# Patient Record
Sex: Male | Born: 2000 | Hispanic: No | Marital: Single | Smoking: Never smoker
Health system: Southern US, Community
[De-identification: ages and names within clinical notes are randomized; demographics above are authoritative.]

---
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
## 2018-07-18 IMAGING — DX DG WRIST COMPLETE 3+V*R*
4 series · 4 of 4 positions shown · non-contrast
Comparison: None.

CLINICAL DATA: Crush injury with laceration

EXAM:
RIGHT WRIST - COMPLETE 3+ VIEW

[wrist pa]
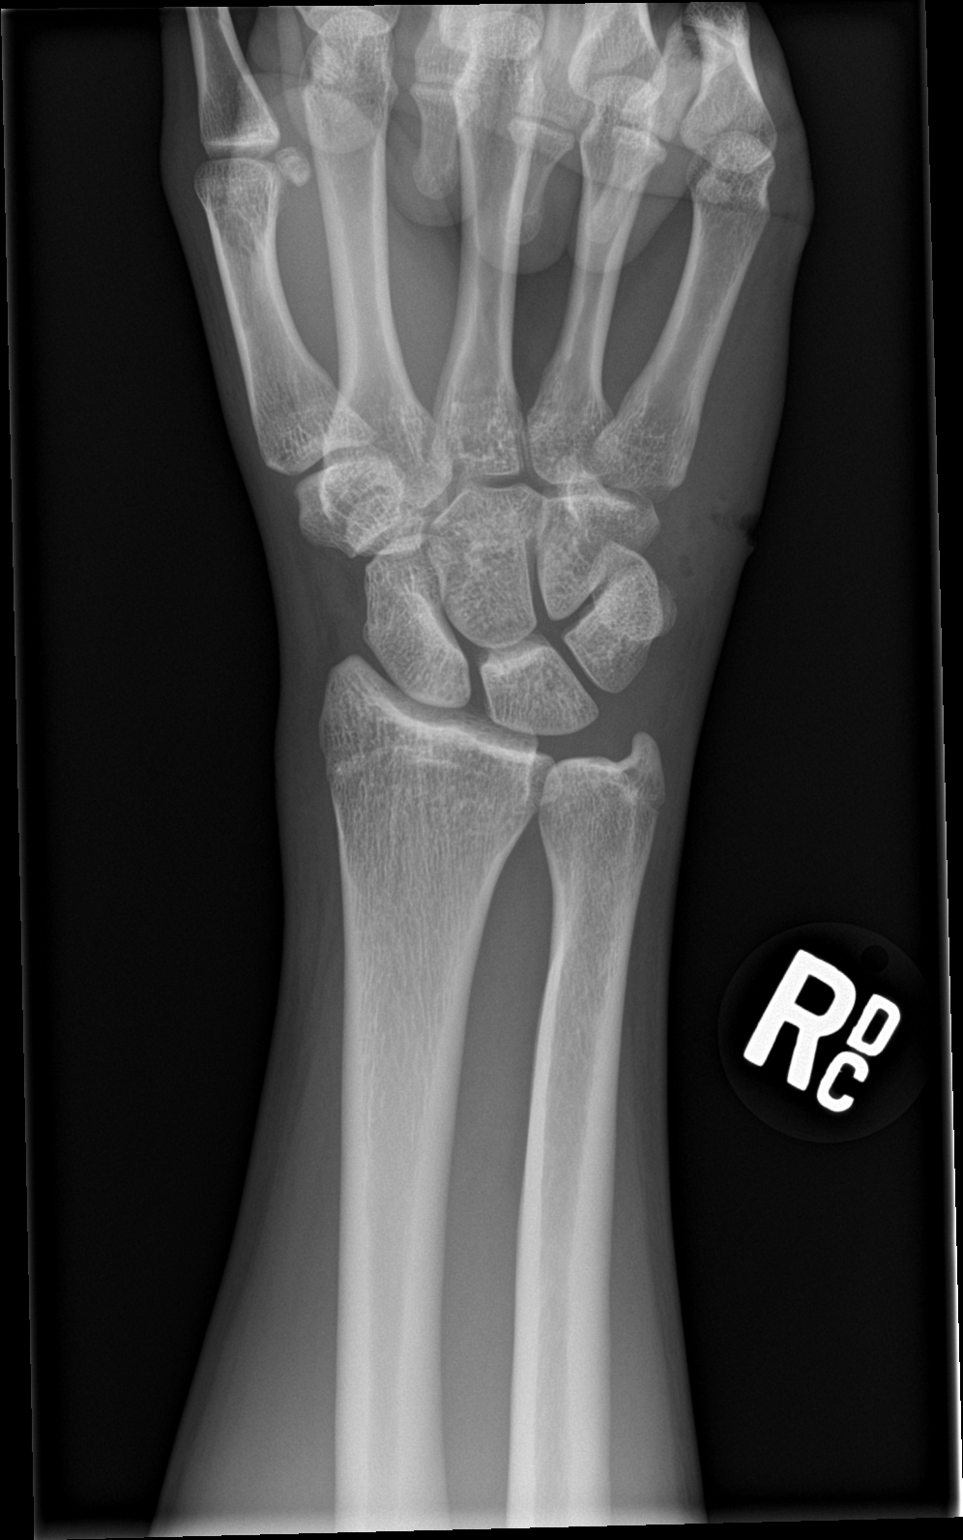

[wrist obl]
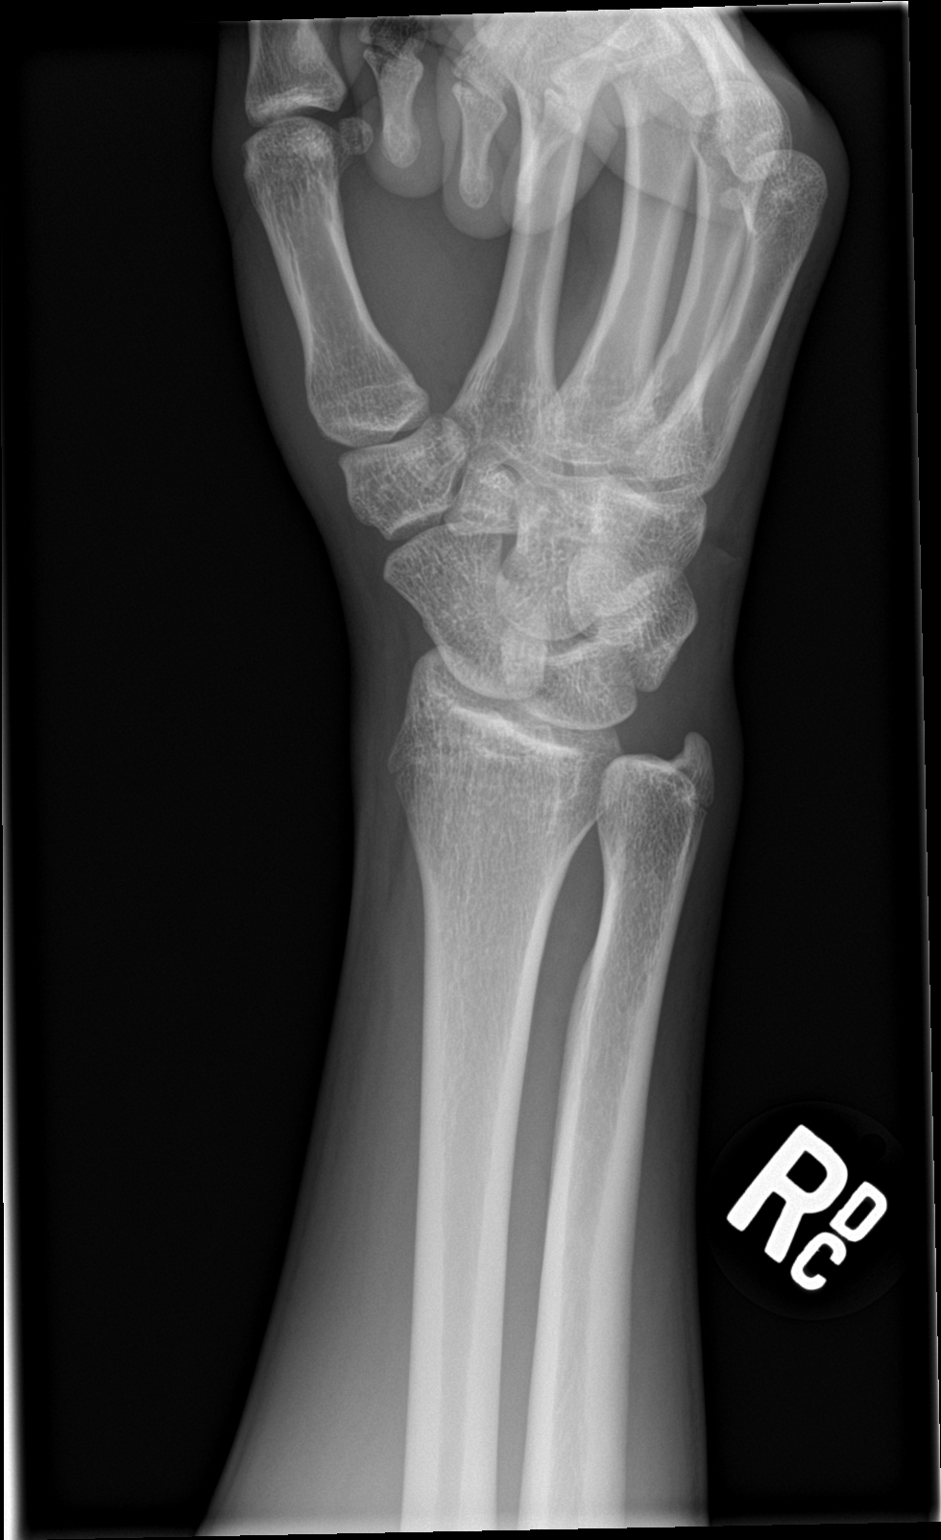

[wrist lat]
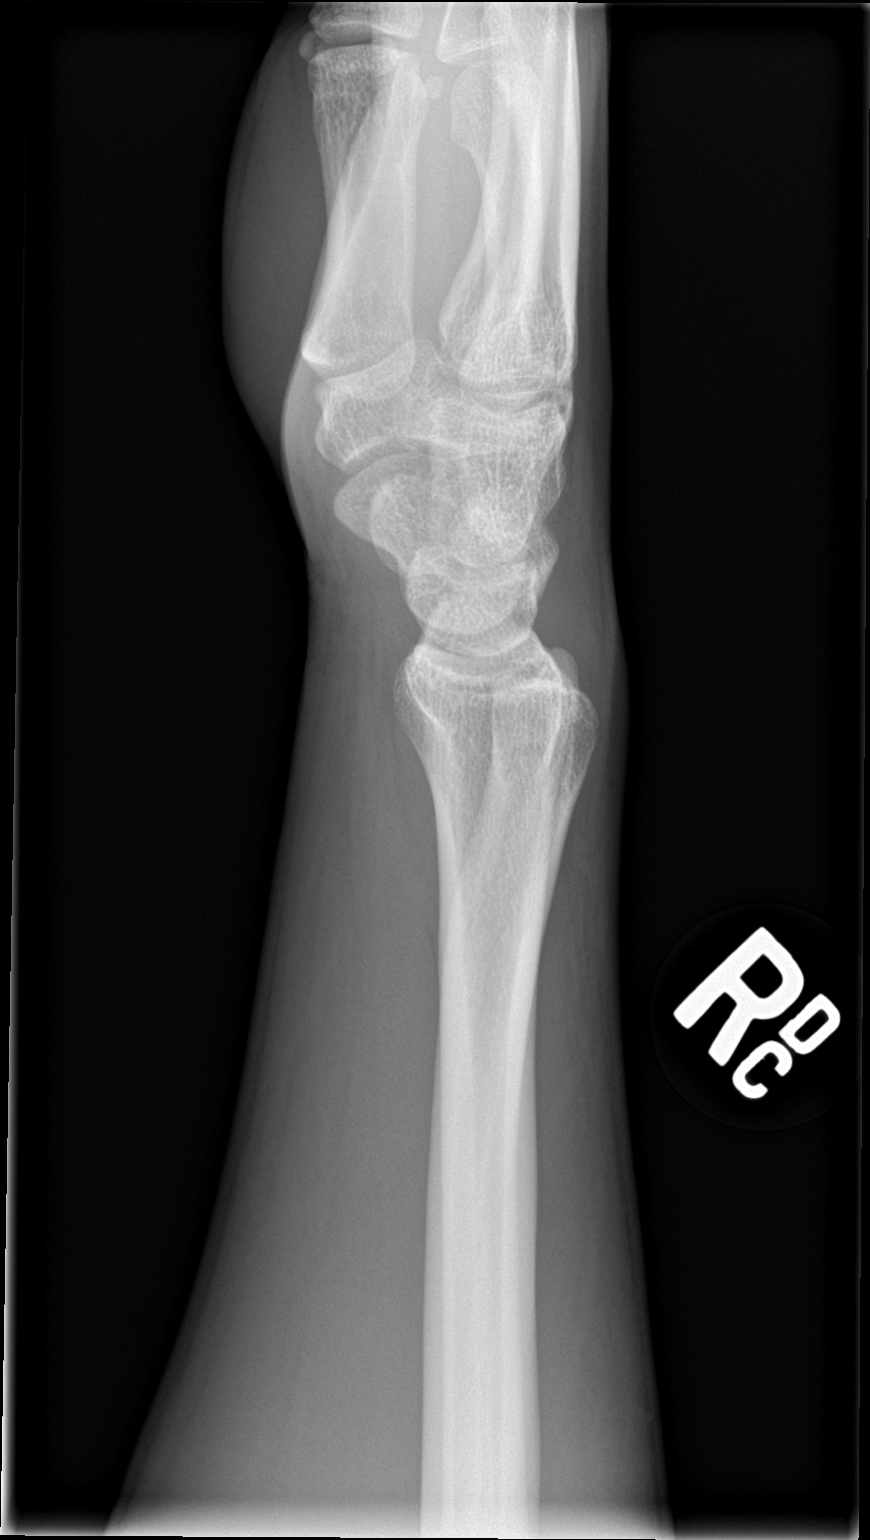

[wrist navicular]
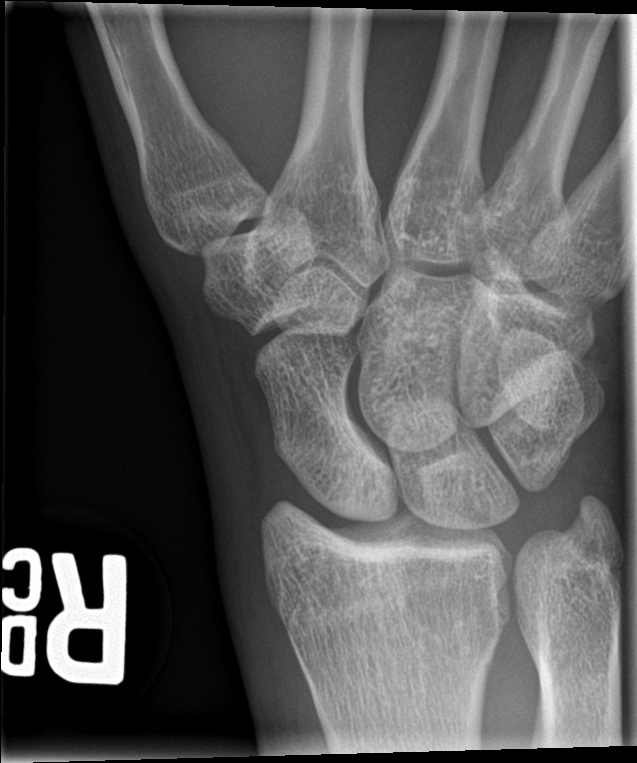

[4 of 4 positions shown; findings below may reference images not displayed]

FINDINGS: No fracture or malalignment. Gas in the soft tissues ulnar aspect of
the wrist consistent with laceration. No radiopaque foreign body
IMPRESSION: No acute osseous abnormality.

## 2022-10-14 ENCOUNTER — Ambulatory Visit: Payer: Self-pay | Admitting: Nurse Practitioner

## 2022-10-14 NOTE — Progress Notes (Deleted)
   New Patient Office Visit  Subjective    Patient ID: Ethan Sullivan, male    DOB: 2001/04/01  Age: 22 y.o. MRN: [id number]  CC: No chief complaint on file.   HPI Ethan Sullivan presents to establish care ***  Outpatient Encounter Medications as of 10/14/2022  Medication Sig   benzonatate (TESSALON) 200 MG capsule Take 1 capsule (200 mg total) by mouth 3 (three) times daily as needed.   fluticasone (FLONASE) 50 MCG/ACT nasal spray Place 2 sprays into both nostrils daily.   ibuprofen (ADVIL,MOTRIN) 600 MG tablet Take 1 tablet (600 mg total) by mouth every 6 (six) hours as needed.   naproxen sodium (ANAPROX) 275 MG tablet Take 1 tablet (275 mg total) by mouth 2 (two) times daily with a meal. (Patient not taking: Reported on 02/13/2015)   No facility-administered encounter medications on file as of 10/14/2022.    No past medical history on file.  No past surgical history on file.  No family history on file.  Social History   Socioeconomic History   Marital status: Single    Spouse name: Not on file   Number of children: Not on file   Years of education: Not on file   Highest education level: Not on file  Occupational History   Not on file  Tobacco Use   Smoking status: Never   Smokeless tobacco: Never  Substance and Sexual Activity   Alcohol use: No   Drug use: No   Sexual activity: Not on file  Other Topics Concern   Not on file  Social History Narrative   Not on file   Social Determinants of Health   Financial Resource Strain: Not on file  Food Insecurity: Not on file  Transportation Needs: Not on file  Physical Activity: Not on file  Stress: Not on file  Social Connections: Not on file  Intimate Partner Violence: Not on file    ROS Negative unless indicated in HPI   Objective    There were no vitals taken for this visit.  Physical Exam  {Labs (Optional):23779}    Assessment & Plan:  There are no diagnoses linked to this encounter.  No follow-ups on file.    @Dallis Darden  Janee Morn DNP@

## 2022-10-28 ENCOUNTER — Ambulatory Visit: Payer: Self-pay | Admitting: Nurse Practitioner

## 2022-10-28 ENCOUNTER — Encounter: Payer: Self-pay | Admitting: General Practice

## 2022-10-28 NOTE — Progress Notes (Deleted)
   Established Patient Office Visit  Subjective   Patient ID: Ethan Sullivan, male    DOB: 2000/12/21  Age: 22 y.o. MRN: [id number]  No chief complaint on file.   HPI  There are no problems to display for this patient.  No past medical history on file. No past surgical history on file. Social History   Tobacco Use   Smoking status: Never   Smokeless tobacco: Never  Substance Use Topics   Alcohol use: No   Drug use: No   Social History   Socioeconomic History   Marital status: Single    Spouse name: Not on file   Number of children: Not on file   Years of education: Not on file   Highest education level: Not on file  Occupational History   Not on file  Tobacco Use   Smoking status: Never   Smokeless tobacco: Never  Substance and Sexual Activity   Alcohol use: No   Drug use: No   Sexual activity: Not on file  Other Topics Concern   Not on file  Social History Narrative   Not on file   Social Determinants of Health   Financial Resource Strain: Not on file  Food Insecurity: Not on file  Transportation Needs: Not on file  Physical Activity: Not on file  Stress: Not on file  Social Connections: Unknown (08/21/2021)   Received from [facility]   Social Network    Social Network: Not on file  Intimate Partner Violence: Unknown (07/13/2021)   Received from [facility]   HITS    Physically Hurt: Not on file    Insult or Talk Down To: Not on file    Threaten Physical Harm: Not on file    Scream or Curse: Not on file   Family Status  Relation Name Status   Mother  Alive   Father  Alive   Sister  Alive   Brother  Alive  No partnership data on file   No family history on file. No Known Allergies    ROS Negative unless indicated in HPI   Objective:     There were no vitals taken for this visit. BP Readings from Last 3 Encounters:  08/26/17 (!) 114/58  01/04/17 135/80  08/16/16 (!) 135/65 (95%, Z = 1.64 /  42%, Z = -0.20)*   *BP percentiles are based  on the 2017 AAP Clinical Practice Guideline for boys   Wt Readings from Last 3 Encounters:  08/26/17 141 lb (64 kg) (32%, Z= -0.46)*  01/04/17 130 lb (59 kg) (19%, Z= -0.89)*  08/16/16 145 lb (65.8 kg) (47%, Z= -0.08)*   * Growth percentiles are based on CDC (Boys, 2-20 Years) data.      Physical Exam   No results found for any visits on 10/28/22.       Assessment & Plan:  Encounter to establish care    No follow-ups on file.    Arrie Aran Santa Lighter, DNP [facility] [address] [place], [place] [address] [phone]

## 2023-04-10 ENCOUNTER — Ambulatory Visit: Payer: Managed Care, Other (non HMO) | Admitting: Nurse Practitioner
